# Patient Record
Sex: Male | Born: 1965 | Race: Black or African American | Hispanic: No | Marital: Married | Smoking: Former smoker
Health system: Southern US, Community
[De-identification: ages and names within clinical notes are randomized; demographics above are authoritative.]

## PROBLEM LIST (undated history)

## (undated) DIAGNOSIS — E739 Lactose intolerance, unspecified: Secondary | ICD-10-CM

---

## 2006-03-16 ENCOUNTER — Emergency Department (HOSPITAL_COMMUNITY): Admission: EM | Admit: 2006-03-16 | Discharge: 2006-03-16 | Payer: Self-pay | Admitting: Family Medicine

## 2009-05-06 ENCOUNTER — Emergency Department (HOSPITAL_COMMUNITY): Admission: EM | Admit: 2009-05-06 | Discharge: 2009-05-06 | Payer: Self-pay | Admitting: Emergency Medicine

## 2010-02-23 ENCOUNTER — Emergency Department (HOSPITAL_COMMUNITY): Admission: EM | Admit: 2010-02-23 | Discharge: 2010-02-23 | Payer: Self-pay | Admitting: Emergency Medicine

## 2011-01-21 LAB — POCT URINALYSIS DIP (DEVICE)
Bilirubin Urine: NEGATIVE
Glucose, UA: NEGATIVE mg/dL
Ketones, ur: NEGATIVE mg/dL
Nitrite: NEGATIVE
Protein, ur: NEGATIVE mg/dL
Specific Gravity, Urine: 1.02 (ref 1.005–1.030)
Urobilinogen, UA: 0.2 mg/dL (ref 0.0–1.0)
pH: 5 (ref 5.0–8.0)

## 2011-01-21 LAB — GC/CHLAMYDIA PROBE AMP, GENITAL
Chlamydia, DNA Probe: NEGATIVE
GC Probe Amp, Genital: NEGATIVE

## 2011-01-21 LAB — URINE CULTURE: Colony Count: NO GROWTH

## 2011-02-01 ENCOUNTER — Inpatient Hospital Stay (INDEPENDENT_AMBULATORY_CARE_PROVIDER_SITE_OTHER)
Admission: RE | Admit: 2011-02-01 | Discharge: 2011-02-01 | Disposition: A | Discharge status: Home / Self Care | Payer: Self-pay | Source: Ambulatory Visit | Attending: Family Medicine | Admitting: Family Medicine

## 2011-02-01 DIAGNOSIS — J309 Allergic rhinitis, unspecified: Secondary | ICD-10-CM

## 2011-12-01 ENCOUNTER — Encounter (HOSPITAL_COMMUNITY): Payer: Self-pay

## 2011-12-01 ENCOUNTER — Emergency Department (INDEPENDENT_AMBULATORY_CARE_PROVIDER_SITE_OTHER)
Admission: EM | Admit: 2011-12-01 | Discharge: 2011-12-01 | Disposition: A | Discharge status: Home / Self Care | Payer: PRIVATE HEALTH INSURANCE | Source: Home / Self Care | Attending: Family Medicine | Admitting: Family Medicine

## 2011-12-01 DIAGNOSIS — J310 Chronic rhinitis: Secondary | ICD-10-CM

## 2011-12-01 MED ORDER — FLUTICASONE PROPIONATE 50 MCG/ACT NA SUSP: 2.0000 | Freq: Every day | NASAL | Status: DC

## 2011-12-01 NOTE — ED Provider Notes (Signed)
History     CSN: [id number]  Arrival date & time 12/01/11  4098   First MD Initiated Contact with Patient 12/01/11 [phone]      Chief Complaint  Patient presents with  . Facial Pain    (Consider location/radiation/quality/duration/timing/severity/associated sxs/prior treatment) HPI Comments: Bransen presents for evaluation for nasal congestion and sinus pressure since Wednesday. He denies any other symptoms. No cough no fever. No rhinorrhea. He does report seasonal allergies for which he takes Claritin-D. He has been using over-the-counter saline spray with mild relief.  Patient is a 46 y.o. male presenting with sinusitis. The history is provided by the patient.  Sinusitis  This is a new problem. The problem has not changed since onset.There has been no fever. The patient is experiencing no pain. Associated symptoms include congestion. Pertinent negatives include no chills and no sinus pressure.    History reviewed. No pertinent past medical history.  History reviewed. No pertinent past surgical history.  History reviewed. No pertinent family history.  History  Substance Use Topics  . Smoking status: Never Smoker   . Smokeless tobacco: Not on file  . Alcohol Use: No      Review of Systems  Constitutional: Negative.  Negative for fever and chills.  HENT: Positive for congestion. Negative for rhinorrhea and sinus pressure.   Eyes: Negative.   Respiratory: Negative.   Cardiovascular: Negative.   Gastrointestinal: Negative.   Genitourinary: Negative.   Musculoskeletal: Negative.   Skin: Negative.   Neurological: Negative.     Allergies  Review of patient's allergies indicates no known allergies.  Home Medications   Current Outpatient Rx  Name Route Sig Dispense Refill  . SODIUM CHLORIDE 0.65 % NA SOLN Nasal Place 1 spray into the nose as needed.    Marland Kitchen FLUTICASONE PROPIONATE 50 MCG/ACT NA SUSP Nasal Place 2 sprays into the nose daily. 16 g 2    BP 143/78  Pulse 64   Temp(Src) 98 F (36.7 C) (Oral)  Resp 16  SpO2 100%  Physical Exam  Nursing note and vitals reviewed. Constitutional: He is oriented to person, place, and time. He appears well-developed and well-nourished.  HENT:  Head: Normocephalic and atraumatic.  Right Ear: Tympanic membrane is retracted.  Left Ear: Tympanic membrane is retracted.  Mouth/Throat: Uvula is midline, oropharynx is clear and moist and mucous membranes are normal.  Eyes: EOM are normal.  Neck: Normal range of motion.  Pulmonary/Chest: Effort normal.  Musculoskeletal: Normal range of motion.  Neurological: He is alert and oriented to person, place, and time.  Skin: Skin is warm and dry.  Psychiatric: His behavior is normal.    ED Course  Procedures (including critical care time)  Labs Reviewed - No data to display No results found.   1. Rhinitis       MDM  rx given for fluticasone        Richardo Priest, MD 12/01/11 1023

## 2011-12-01 NOTE — Discharge Instructions (Signed)
Use nasal steroid spray as directed and continue your Claritin-D as discussed. You may use an over the counter cough syrup, such as Delsym, Robitussin DM, or Mucinex DM, as needed and as directed. Should you have fever, I recommend aggressive fever control with acetaminophen (Tylenol) and/or ibuprofen. You may use these together, alternating them every 4 hours, or individually, every 8 hours. For example, take acetaminophen 500 to 1000 mg at 12 noon, then 600 to 800 mg of ibuprofen at 4 pm, then acetaminophen at 8 pm, etc. Also, stay hydrated with clear liquids. Return to care should your symptoms not improve, or worsen in any way.

## 2011-12-01 NOTE — ED Notes (Signed)
Pt has sinus pressure that started on Wednesday.

## 2013-03-14 ENCOUNTER — Encounter (HOSPITAL_COMMUNITY): Payer: Self-pay | Admitting: Family Medicine

## 2013-03-14 ENCOUNTER — Emergency Department (HOSPITAL_COMMUNITY)
Admission: EM | Admit: 2013-03-14 | Discharge: 2013-03-14 | Disposition: A | Discharge status: Home / Self Care | Payer: BC Managed Care – PPO | Attending: Emergency Medicine | Admitting: Emergency Medicine

## 2013-03-14 DIAGNOSIS — B349 Viral infection, unspecified: Secondary | ICD-10-CM

## 2013-03-14 DIAGNOSIS — J3489 Other specified disorders of nose and nasal sinuses: Secondary | ICD-10-CM | POA: Insufficient documentation

## 2013-03-14 DIAGNOSIS — J40 Bronchitis, not specified as acute or chronic: Secondary | ICD-10-CM | POA: Insufficient documentation

## 2013-03-14 DIAGNOSIS — F17200 Nicotine dependence, unspecified, uncomplicated: Secondary | ICD-10-CM | POA: Insufficient documentation

## 2013-03-14 DIAGNOSIS — B9789 Other viral agents as the cause of diseases classified elsewhere: Secondary | ICD-10-CM | POA: Insufficient documentation

## 2013-03-14 MED ORDER — BENZONATATE 100 MG PO CAPS: 100.0000 mg | ORAL_CAPSULE | Freq: Three times a day (TID) | ORAL | Status: DC

## 2013-03-14 MED ORDER — GUAIFENESIN-DM 100-10 MG/5ML PO SYRP: 5.0000 mL | ORAL_SOLUTION | Freq: Three times a day (TID) | ORAL | Status: DC | PRN

## 2013-03-14 MED ORDER — PSEUDOEPHEDRINE HCL 30 MG PO TABS: 30.0000 mg | ORAL_TABLET | ORAL | Status: DC | PRN

## 2013-03-14 MED ORDER — AZITHROMYCIN 250 MG PO TABS: 250.0000 mg | ORAL_TABLET | Freq: Every day | ORAL | Status: DC

## 2013-03-14 NOTE — ED Notes (Signed)
Pt states he has been vomiting and has diarrhea and low back pain,  This started on Tuesday and worsened by Wednesday and he has now missed work on Friday and today,  Also noted to have chills,  Vomited times 4

## 2013-03-14 NOTE — ED Provider Notes (Signed)
History     CSN: [id number]  Arrival date & time 03/14/13  1903   First MD Initiated Contact with Patient 03/14/13 2007      Chief Complaint  Patient presents with  . Cough  . Nasal Congestion    (Consider location/radiation/quality/duration/timing/severity/associated sxs/prior treatment) HPI Aaron Simmons is a 47 y.o. male who presents to ED with complaint of nasal congestion, cough, yellow sputum, shortness of breah, nausea, vomiting, diarrhea. Pt states symptoms began 3 days ago. Diarrhea and vomiting has resolved. Continues to cough and have congestion. States taking mucinex with no improvement. Denies fever, chills. No chest pain. No neck pain or stiffness. Mild headache.    History reviewed. No pertinent past medical history.  History reviewed. No pertinent past surgical history.  No family history on file.  History  Substance Use Topics  . Smoking status: Current Every Day Smoker -- 0.25 packs/day    Types: Cigarettes  . Smokeless tobacco: Not on file  . Alcohol Use: No      Review of Systems  Constitutional: Positive for chills and fatigue. Negative for fever.  HENT: Positive for congestion, sore throat and sinus pressure. Negative for neck pain and neck stiffness.   Eyes: Negative for photophobia.  Respiratory: Positive for cough and shortness of breath. Negative for chest tightness.   Cardiovascular: Negative.   Gastrointestinal: Negative for nausea, vomiting and abdominal pain.  Genitourinary: Negative for dysuria and flank pain.  Musculoskeletal: Positive for myalgias.  Skin: Negative for rash.  Neurological: Positive for headaches. Negative for dizziness.    Allergies  Review of patient's allergies indicates no known allergies.  Home Medications   Current Outpatient Rx  Name  Route  Sig  Dispense  Refill  . sodium chloride (OCEAN) 0.65 % nasal spray   Nasal   Place 1 spray into the nose as needed.           BP 129/73  Pulse 72  Temp(Src) 98.8  F (37.1 C) (Oral)  Resp 20  Ht 6' (1.829 m)  Wt 185 lb (83.915 kg)  BMI 25.08 kg/m2  SpO2 98%  Physical Exam  Nursing note and vitals reviewed. Constitutional: He is oriented to person, place, and time. He appears well-developed and well-nourished. No distress.  HENT:  Head: Normocephalic.  Right Ear: External ear normal.  Left Ear: External ear normal.  Nose: Nose normal.  Mouth/Throat: Oropharynx is clear and moist. No oropharyngeal exudate.  TMs normal bilaterally. Pharynx normal, uvula midline  Eyes: Conjunctivae are normal.  Neck: Normal range of motion. Neck supple.  Cardiovascular: Normal rate, regular rhythm and normal heart sounds.   Pulmonary/Chest: Effort normal and breath sounds normal. No respiratory distress. He has no wheezes. He has no rales. He exhibits no tenderness.  Abdominal: Soft. Bowel sounds are normal. He exhibits no distension. There is no tenderness.  Musculoskeletal: He exhibits no edema.  Lymphadenopathy:    He has no cervical adenopathy.  Neurological: He is alert and oriented to person, place, and time.  Skin: Skin is warm and dry.    ED Course  Procedures (including critical care time)  Labs Reviewed - No data to display No results found.   1. Viral syndrome   2. Bronchitis       MDM  Pt with nasal congestion, green productive sputum, fatigue. On exam, no findings. Lungs are clear. No signs of sinus disease or pharyngeal infection or abscess. Pt afebrile. Requesting antibiotics for "green sputum." explained that i believe his symptoms  are viral. Will treat symptomatically. Given a prescription for zpack if symptoms worsening. Pt voiced understanding.   Filed Vitals:   03/14/13 1925  BP: 129/73  Pulse: 72  Temp: 98.8 F (37.1 C)  TempSrc: Oral  Resp: 20  Height: 6' (1.829 m)  Weight: 185 lb (83.915 kg)  SpO2: 98%           Lottie Mussel, PA-C 03/15/13 [phone]

## 2013-03-14 NOTE — ED Notes (Signed)
Patient states that he has had cough and congestion x 3 days. States that he has tried Mucinex with minimal relief of symptoms.

## 2013-03-19 NOTE — ED Provider Notes (Signed)
Medical screening examination/treatment/procedure(s) were performed by non-physician practitioner and as supervising physician I was immediately available for consultation/collaboration.  Tiffnay Bossi, MD 03/19/13 0511 

## 2013-06-29 ENCOUNTER — Emergency Department (HOSPITAL_COMMUNITY)
Admission: EM | Admit: 2013-06-29 | Discharge: 2013-06-29 | Disposition: A | Discharge status: Home / Self Care | Payer: BC Managed Care – PPO | Source: Home / Self Care | Attending: Family Medicine | Admitting: Family Medicine

## 2013-06-29 ENCOUNTER — Encounter (HOSPITAL_COMMUNITY): Payer: Self-pay

## 2013-06-29 DIAGNOSIS — K2970 Gastritis, unspecified, without bleeding: Secondary | ICD-10-CM

## 2013-06-29 HISTORY — DX: Lactose intolerance, unspecified: E73.9

## 2013-06-29 MED ORDER — GI COCKTAIL ~~LOC~~
ORAL | Status: AC
  Filled 2013-06-29: qty 30

## 2013-06-29 MED ORDER — RANITIDINE HCL 150 MG PO TABS: 150.0000 mg | ORAL_TABLET | Freq: Two times a day (BID) | ORAL | Status: DC

## 2013-06-29 MED ORDER — GI COCKTAIL ~~LOC~~
30.0000 mL | Freq: Once | ORAL | Status: AC
  Administered 2013-06-29: 30 mL via ORAL

## 2013-06-29 MED ORDER — SUCRALFATE 1 G PO TABS
1.0000 g | ORAL_TABLET | Freq: Four times a day (QID) | ORAL | Status: DC
Start: 2013-06-29 — End: 2014-04-10

## 2013-06-29 NOTE — ED Notes (Signed)
States he is lactose intolerant , and when he eats cheese, it affects him for 2-3 weeks at a time.  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 Used Tums at frequent intervals w brief relief of pain and nausea. NAD at present

## 2013-06-29 NOTE — ED Provider Notes (Signed)
CSN: [id number]     Arrival date & time 06/29/13  4098 History   First MD Initiated Contact with Patient 06/29/13 1139     Chief Complaint  Patient presents with  . Abdominal Pain   (Consider location/radiation/quality/duration/timing/severity/associated sxs/prior Treatment) HPI Comments: Pt ate cheese on 9/13, has lactose intolerance. Usually tums manages pain and stomach upset for pt when eats cheese but isn't helping this time.   Patient is a 47 y.o. male presenting with abdominal pain. The history is provided by the patient.  Abdominal Pain This is a new problem. Episode onset: 2 days ago. The problem occurs constantly. The problem has not changed since onset.Associated symptoms include abdominal pain. Pertinent negatives include no chest pain and no shortness of breath. Nothing aggravates the symptoms. Nothing relieves the symptoms. Treatments tried: tums. The treatment provided mild relief.    Past Medical History  Diagnosis Date  . Lactose intolerance    History reviewed. No pertinent past surgical history. History reviewed. No pertinent family history. History  Substance Use Topics  . Smoking status: Current Every Day Smoker -- 0.25 packs/day    Types: Cigarettes  . Smokeless tobacco: Not on file  . Alcohol Use: No    Review of Systems  Constitutional: Negative for fever and chills.  Respiratory: Negative for shortness of breath.   Cardiovascular: Negative for chest pain.  Gastrointestinal: Positive for abdominal pain. Negative for nausea, vomiting, diarrhea, constipation and abdominal distention.    Allergies  Cheese  Home Medications   Current Outpatient Rx  Name  Route  Sig  Dispense  Refill  . ranitidine (ZANTAC) 150 MG tablet   Oral   Take 1 tablet (150 mg total) by mouth 2 (two) times daily.   60 tablet   0   . sodium chloride (OCEAN) 0.65 % nasal spray   Nasal   Place 1 spray into the nose as needed.         . sucralfate (CARAFATE) 1 G tablet  Oral   Take 1 tablet (1 g total) by mouth 4 (four) times daily.   28 tablet   0    BP 145/78  Pulse 62  Temp(Src) 97.8 F (36.6 C) (Oral)  Resp 16  SpO2 98% Physical Exam  Constitutional: He appears well-developed and well-nourished. No distress.  Cardiovascular: Normal rate and regular rhythm.   Pulmonary/Chest: Effort normal and breath sounds normal.  Abdominal: Soft. Normal appearance and bowel sounds are normal. He exhibits no distension. There is no hepatosplenomegaly. There is tenderness in the epigastric area. There is no rigidity, no rebound and no guarding.    ED Course  Procedures (including critical care time) Labs Review Labs Reviewed - No data to display Imaging Review No results found.  MDM   1. Gastritis   Pt sx resolved with GI cocktail. Rx carafate 1gm QID #28 and zantac 150mg  BID #60. Pt to avoid cheese.   ekg normal except slight bradycardia at 59bpm  Cathlyn Parsons, NP 06/29/13 1142  Cathlyn Parsons, NP 06/29/13 1144

## 2013-06-30 NOTE — ED Provider Notes (Signed)
Medical screening examination/treatment/procedure(s) were performed by a resident physician or non-physician practitioner and as the supervising physician I was immediately available for consultation/collaboration.  Kearstin Learn, MD    Tiphani Mells S Gracy Ehly, MD 06/30/13 1337 

## 2014-04-10 ENCOUNTER — Emergency Department (HOSPITAL_COMMUNITY)
Admission: EM | Admit: 2014-04-10 | Discharge: 2014-04-10 | Disposition: A | Discharge status: Home / Self Care | Payer: BC Managed Care – PPO | Attending: Emergency Medicine | Admitting: Emergency Medicine

## 2014-04-10 ENCOUNTER — Encounter (HOSPITAL_COMMUNITY): Payer: Self-pay | Admitting: Emergency Medicine

## 2014-04-10 DIAGNOSIS — H538 Other visual disturbances: Secondary | ICD-10-CM | POA: Insufficient documentation

## 2014-04-10 DIAGNOSIS — T675XXA Heat exhaustion, unspecified, initial encounter: Secondary | ICD-10-CM | POA: Insufficient documentation

## 2014-04-10 DIAGNOSIS — Y9389 Activity, other specified: Secondary | ICD-10-CM | POA: Insufficient documentation

## 2014-04-10 DIAGNOSIS — E860 Dehydration: Secondary | ICD-10-CM

## 2014-04-10 DIAGNOSIS — R110 Nausea: Secondary | ICD-10-CM | POA: Insufficient documentation

## 2014-04-10 DIAGNOSIS — X30XXXA Exposure to excessive natural heat, initial encounter: Secondary | ICD-10-CM | POA: Insufficient documentation

## 2014-04-10 DIAGNOSIS — Y9289 Other specified places as the place of occurrence of the external cause: Secondary | ICD-10-CM | POA: Insufficient documentation

## 2014-04-10 DIAGNOSIS — F17200 Nicotine dependence, unspecified, uncomplicated: Secondary | ICD-10-CM | POA: Insufficient documentation

## 2014-04-10 LAB — CBC
HCT: 43.2 % (ref 39.0–52.0)
HEMOGLOBIN: 15.2 g/dL (ref 13.0–17.0)
MCH: 34.2 pg — ABNORMAL HIGH (ref 26.0–34.0)
MCHC: 35.2 g/dL (ref 30.0–36.0)
MCV: 97.1 fL (ref 78.0–100.0)
PLATELETS: 181 10*3/uL (ref 150–400)
RBC: 4.45 MIL/uL (ref 4.22–5.81)
RDW: 12.7 % (ref 11.5–15.5)
WBC: 8.3 10*3/uL (ref 4.0–10.5)

## 2014-04-10 LAB — URINALYSIS, ROUTINE W REFLEX MICROSCOPIC
BILIRUBIN URINE: NEGATIVE
Glucose, UA: NEGATIVE mg/dL
Hgb urine dipstick: NEGATIVE
Ketones, ur: NEGATIVE mg/dL
LEUKOCYTES UA: NEGATIVE
Nitrite: NEGATIVE
PH: 6 (ref 5.0–8.0)
Protein, ur: NEGATIVE mg/dL
SPECIFIC GRAVITY, URINE: 1.022 (ref 1.005–1.030)
UROBILINOGEN UA: 0.2 mg/dL (ref 0.0–1.0)

## 2014-04-10 LAB — COMPREHENSIVE METABOLIC PANEL
ALK PHOS: 107 U/L (ref 39–117)
ALT: 26 U/L (ref 0–53)
AST: 20 U/L (ref 0–37)
Albumin: 4 g/dL (ref 3.5–5.2)
BILIRUBIN TOTAL: 0.4 mg/dL (ref 0.3–1.2)
BUN: 9 mg/dL (ref 6–23)
CHLORIDE: 101 meq/L (ref 96–112)
CO2: 25 meq/L (ref 19–32)
Calcium: 9.2 mg/dL (ref 8.4–10.5)
Creatinine, Ser: 0.94 mg/dL (ref 0.50–1.35)
GLUCOSE: 98 mg/dL (ref 70–99)
POTASSIUM: 3.9 meq/L (ref 3.7–5.3)
Sodium: 138 mEq/L (ref 137–147)
Total Protein: 7.8 g/dL (ref 6.0–8.3)

## 2014-04-10 LAB — CK: CK TOTAL: 158 U/L (ref 7–232)

## 2014-04-10 LAB — TROPONIN I: Troponin I: <0.3 ng/mL (ref <0.30)

## 2014-04-10 LAB — LIPASE, BLOOD: LIPASE: 19 U/L (ref 11–59)

## 2014-04-10 LAB — I-STAT TROPONIN, ED: TROPONIN I, POC: 0 ng/mL (ref 0.00–0.08)

## 2014-04-10 MED ORDER — SODIUM CHLORIDE 0.9 % IV BOLUS (SEPSIS)
1000.0000 mL | Freq: Once | INTRAVENOUS | Status: AC
  Administered 2014-04-10: 1000 mL via INTRAVENOUS

## 2014-04-10 MED ORDER — SODIUM CHLORIDE 0.9 % IV SOLN: 1000.0000 mL | INTRAVENOUS | Status: DC

## 2014-04-10 MED ORDER — ONDANSETRON HCL 4 MG/2ML IJ SOLN
4.0000 mg | Freq: Once | INTRAMUSCULAR | Status: AC
  Administered 2014-04-10: 4 mg via INTRAVENOUS
  Filled 2014-04-10: qty 2

## 2014-04-10 MED ORDER — SODIUM CHLORIDE 0.9 % IV SOLN
1000.0000 mL | Freq: Once | INTRAVENOUS | Status: AC
  Administered 2014-04-10: 1000 mL via INTRAVENOUS

## 2014-04-10 NOTE — ED Notes (Signed)
All symptoms started 3 days ago when he was cutting grass.

## 2014-04-10 NOTE — Discharge Instructions (Signed)
Drink lots of fluids when working out in the heat, like sports drinks.  Recheck if you feel worse in any way.  Heat-Related Illness Heat-related illnesses occur when the body is unable to properly cool itself. The body normally cools itself by sweating. However, under some conditions sweating is not enough. In these cases, a person's body temperature rises rapidly. Very high body temperatures may damage the brain or other vital organs. Some examples of heat-related illnesses include:  Heat stroke. This occurs when the body is unable to regulate its temperature. The body's temperature rises rapidly, the sweating mechanism fails, and the body is unable to cool down. Body temperature may rise to 106 F (41 C) or higher within 10 to 15 minutes. Heat stroke can cause death or permanent disability if emergency treatment is not provided.  Heat exhaustion. This is a milder form of heat-related illness that can develop after several days of exposure to high temperatures and not enough fluids. It is the body's response to an excessive loss of the water and salt contained in sweat.  Heat cramps. These usually affect people who sweat a lot during heavy activity. This sweating drains the body's salt and moisture. The low salt level in the muscles causes painful cramps. Heat cramps may also be a symptom of heat exhaustion. Heat cramps usually occur in the abdomen, arms, or legs. Get medical attention for cramps if you have heart problems or are on a low-sodium diet. Those that are at greatest risk for heat-related illnesses include:   The elderly.  Infant and the very young.  People with mental illness and chronic diseases.  People who are overweight (obese).  Young and healthy people can even succumb to heat if they participate in strenuous physical activities during hot weather. CAUSES  Several factors affect the body's ability to cool itself during extremely hot weather. When the humidity is high, sweat  will not evaporate as quickly. This prevents the body from releasing heat quickly. Other factors that can affect the body's ability to cool down include:   Age.  Obesity.  Fever.  Dehydration.  Heart disease.  Mental illness.  Poor circulation.  Sunburn.  Prescription drug use.  Alcohol use. SYMPTOMS  Heat stroke: Warning signs of heat stroke vary, but may include:  An extremely high body temperature (above 103F orally).  A fast, strong pulse.  Dizziness.  Confusion.  Red, hot, and dry skin.  No sweating.  Throbbing headache.  Feeling sick to your stomach (nauseous).  Unconsciousness. Heat exhaustion: Warning signs of heat exhaustion include:  Heavy sweating.  Tiredness.  Headache.  Paleness.  Weakness.  Feeling sick to your stomach (nauseous) or vomiting.  Muscle cramps. Heat cramps  Muscle pains or spasms. TREATMENT  Heat stroke  Get into a cool environment. An indoor place that is air-conditioned may be best.  Take a cool shower or bath. Have someone around to make sure you are okay.  Take your temperature. Make sure it is going down. Heat exhaustion  Drink plenty of fluids. Do not drink liquids that contain caffeine, alcohol, or large amounts of sugar. These cause you to lose more body fluid. Also, avoid very cold drinks. They can cause stomach cramps.  Get into a cool environment. An indoor place that is air-conditioned may be best.  Take a cool shower or bath. Have someone around to make sure you are okay.  Put on lightweight clothing. Heat cramps  Stop whatever activity you were doing. Do not attempt  to do that activity for at least 3 hours after the cramps have gone away.  Get into a cool environment. An indoor place that is air-conditioned may be best. HOME CARE INSTRUCTIONS  To protect your health when temperatures are extremely high, follow these tips:  During heavy exercise in a hot environment, drink two to four glasses  (16-32 ounces) of cool fluids each hour. Do not wait until you are thirsty to drink. Warning: If your caregiver limits the amount of fluid you drink or has you on water pills, ask how much you should drink while the weather is hot.  Do not drink liquids that contain caffeine, alcohol, or large amounts of sugar. These cause you to lose more body fluid.  Avoid very cold drinks. They can cause stomach cramps.  Wear appropriate clothing. Choose lightweight, light-colored, loose-fitting clothing.  If you must be outdoors, try to limit your outdoor activity to morning and evening hours. Try to rest often in shady areas.  If you are not used to working or exercising in a hot environment, start slowly and pick up the pace gradually.  Stay cool in an air-conditioned place if possible. If your home does not have air conditioning, go to the shopping mall or [employer].  Taking a cool shower or bath may help you cool off. SEEK MEDICAL CARE IF:   You see any of the symptoms listed above. You may be dealing with a life-threatening emergency.  Symptoms worsen or last longer than 1 hour.  Heat cramps do not get better in 1 hour. MAKE SURE YOU:   Understand these instructions.  Will watch your condition.  Will get help right away if you are not doing well or get worse. Document Released: 07/10/2008 Document Revised: 12/24/2011 Document Reviewed: 07/10/2008 [facility] Patient Information 2015 [employer], [place]. This information is not intended to replace advice given to you by your health care provider. Make sure you discuss any questions you have with your health care provider.

## 2014-04-10 NOTE — ED Notes (Signed)
Patient reports that he was out in the heat 3 days ago and developed nausea, weaknesa, and blurred vision. Patient states he has had the symptoms constant since then.

## 2014-04-10 NOTE — ED Notes (Signed)
Pt aware of the need for a urine sample. Urinal at bedside. 

## 2014-04-10 NOTE — ED Notes (Signed)
MD I. Knapp at bedside. 

## 2014-04-10 NOTE — ED Provider Notes (Signed)
CSN: [id number]     Arrival date & time 04/10/14  [id number] History   First MD Initiated Contact with Patient 04/10/14 0802     Chief Complaint  Patient presents with  . Nausea  . Dizziness  . Blurred Vision     (Consider location/radiation/quality/duration/timing/severity/associated sxs/prior Treatment) Patient is a 48 y.o. male presenting with dizziness.  Dizziness  Patient reports 3 days ago he was outside in the heat mowing grass with a push mower. He states he was outside about 2 hours however he states he took frequent breaks and drank water. He states he was sweating profusely. Later that night he started feeling bad. He reports he was having dizziness and started having some intermittent epigastric abdominal pain. The following day he continued to feel dizzy. He states he felt bad and stayed in bed all day. He didn't eat because he was having nausea. He states he did have 2 episodes of vomiting. He describes a sharp epigastric pain like "twisting a knot" in his stomach. The pain became constant that day. He also describes decreased urinary output. He denies diarrhea, he is unsure of fever, but he continued to have sweating throughout the night. Yesterday he continued to have decreased appetite because of nausea. He continued to have pain. He states he feels dizzy and feels like he is going to pass out especially when he stands up. He states he's never felt this way before.  PCP none  Past Medical History  Diagnosis Date  . Lactose intolerance    History reviewed. No pertinent past surgical history. Family History  Problem Relation Age of Onset  . Heart failure Mother   . Heart failure Sister    History  Substance Use Topics  . Smoking status: Current Every Day Smoker -- 0.25 packs/day    Types: Cigarettes  . Smokeless tobacco: Never Used  . Alcohol Use: No  employed Lives at home Lives with spouse Has a quit date to stop smoking on his birthday  Review of Systems   Neurological: Positive for dizziness.  All other systems reviewed and are negative.     Allergies  Cheese  Home Medications   Prior to Admission medications   Not on File   BP 139/89  Pulse 64  Temp(Src) 98.2 F (36.8 C) (Oral)  Resp 17  Ht 6' (1.829 m)  Wt 182 lb (82.555 kg)  BMI 24.68 kg/m2  SpO2 97%  Vital signs normal   Physical Exam  Nursing note and vitals reviewed. Constitutional: He is oriented to person, place, and time. He appears well-developed and well-nourished.  Non-toxic appearance. He does not appear ill. No distress.  HENT:  Head: Normocephalic and atraumatic.  Right Ear: External ear normal.  Left Ear: External ear normal.  Nose: Nose normal. No mucosal edema or rhinorrhea.  Mouth/Throat: Mucous membranes are normal. No dental abscesses or uvula swelling.   Mucus membranes are dry  Eyes: Conjunctivae and EOM are normal. Pupils are equal, round, and reactive to light.  Neck: Normal range of motion and full passive range of motion without pain. Neck supple.  Cardiovascular: Normal rate, regular rhythm and normal heart sounds.  Exam reveals no gallop and no friction rub.   No murmur heard. Pulmonary/Chest: Effort normal and breath sounds normal. No respiratory distress. He has no wheezes. He has no rhonchi. He has no rales. He exhibits no tenderness and no crepitus.  Abdominal: Soft. Normal appearance and bowel sounds are normal. He exhibits no distension. There is  tenderness. There is no rebound and no guarding.  Patient has diffuse abdominal tenderness on exam, however he states he's having epigastric pain.  Musculoskeletal: Normal range of motion. He exhibits no edema and no tenderness.  Moves all extremities well.   Neurological: He is alert and oriented to person, place, and time. He has normal strength. No cranial nerve deficit.  Skin: Skin is warm, dry and intact. No rash noted. No erythema. No pallor.  Psychiatric: He has a normal mood and  affect. His speech is normal and behavior is normal. His mood appears not anxious.    ED Course  Procedures (including critical care time)  Medications  ondansetron (ZOFRAN) injection 4 mg (4 mg Intravenous Given 04/10/14 0838)  0.9 %  sodium chloride infusion (0 mLs Intravenous Stopped 04/10/14 1122)    Followed by  0.9 %  sodium chloride infusion (0 mLs Intravenous Stopped 04/10/14 0949)  sodium chloride 0.9 % bolus 1,000 mL (0 mLs Intravenous Stopped 04/10/14 1205)   Recheck at 1100 patient states he's feeling much better. He has had minimal urinary output after 2 L of normal saline and drinking a bottle of water. He states his nausea is gone. His dizziness is gone. Patient is going to receive one more liter of IV fluids to make sure he is hydrated. We discussed mowing the grass earlier in the day before it gets hot or very late in the evening when it gets cooler. We also discussed drinking sports drinks when he sweating a lot in the heat.  Labs Review Results for orders placed during the hospital encounter of 04/10/14  CBC      Result Value Ref Range   WBC 8.3  4.0 - 10.5 K/uL   RBC 4.45  4.22 - 5.81 MIL/uL   Hemoglobin 15.2  13.0 - 17.0 g/dL   HCT 16.1  09.6 - 04.5 %   MCV 97.1  78.0 - 100.0 fL   MCH 34.2 (*) 26.0 - 34.0 pg   MCHC 35.2  30.0 - 36.0 g/dL   RDW 40.9  81.1 - 91.4 %   Platelets 181  150 - 400 K/uL  COMPREHENSIVE METABOLIC PANEL      Result Value Ref Range   Sodium 138  137 - 147 mEq/L   Potassium 3.9  3.7 - 5.3 mEq/L   Chloride 101  96 - 112 mEq/L   CO2 25  19 - 32 mEq/L   Glucose, Bld 98  70 - 99 mg/dL   BUN 9  6 - 23 mg/dL   Creatinine, Ser 7.82  0.50 - 1.35 mg/dL   Calcium 9.2  8.4 - 95.6 mg/dL   Total Protein 7.8  6.0 - 8.3 g/dL   Albumin 4.0  3.5 - 5.2 g/dL   AST 20  0 - 37 U/L   ALT 26  0 - 53 U/L   Alkaline Phosphatase 107  39 - 117 U/L   Total Bilirubin 0.4  0.3 - 1.2 mg/dL   GFR calc non Af Amer >90  >90 mL/min   GFR calc Af Amer >90  >90 mL/min   URINALYSIS, ROUTINE W REFLEX MICROSCOPIC      Result Value Ref Range   Color, Urine YELLOW  YELLOW   APPearance CLEAR  CLEAR   Specific Gravity, Urine 1.022  1.005 - 1.030   pH 6.0  5.0 - 8.0   Glucose, UA NEGATIVE  NEGATIVE mg/dL   Hgb urine dipstick NEGATIVE  NEGATIVE   Bilirubin Urine  NEGATIVE  NEGATIVE   Ketones, ur NEGATIVE  NEGATIVE mg/dL   Protein, ur NEGATIVE  NEGATIVE mg/dL   Urobilinogen, UA 0.2  0.0 - 1.0 mg/dL   Nitrite NEGATIVE  NEGATIVE   Leukocytes, UA NEGATIVE  NEGATIVE  TROPONIN I      Result Value Ref Range   Troponin I <0.30  <0.30 ng/mL  LIPASE, BLOOD      Result Value Ref Range   Lipase 19  11 - 59 U/L  CK      Result Value Ref Range   Total CK 158  7 - 232 U/L  I-STAT TROPOININ, ED      Result Value Ref Range   Troponin i, poc 0.00  0.00 - 0.08 ng/mL   Comment 3            Laboratory interpretation all normal     Imaging Review No results found.   EKG Interpretation None      MDM   Final diagnoses:  Heat exhaustion, initial encounter  Dehydration    Plan discharge   Devoria AlbeIva Knapp, MD, Franz DellFACEP     Iva L Knapp, MD 04/10/14 [phone]

## 2014-04-29 ENCOUNTER — Encounter (HOSPITAL_COMMUNITY): Payer: Self-pay | Admitting: Emergency Medicine

## 2014-04-29 ENCOUNTER — Emergency Department (HOSPITAL_COMMUNITY)
Admission: EM | Admit: 2014-04-29 | Discharge: 2014-04-29 | Disposition: A | Discharge status: Home / Self Care | Payer: BC Managed Care – PPO | Attending: Emergency Medicine | Admitting: Emergency Medicine

## 2014-04-29 DIAGNOSIS — H00019 Hordeolum externum unspecified eye, unspecified eyelid: Secondary | ICD-10-CM | POA: Insufficient documentation

## 2014-04-29 DIAGNOSIS — Z8639 Personal history of other endocrine, nutritional and metabolic disease: Secondary | ICD-10-CM | POA: Insufficient documentation

## 2014-04-29 DIAGNOSIS — H00013 Hordeolum externum right eye, unspecified eyelid: Secondary | ICD-10-CM

## 2014-04-29 DIAGNOSIS — Z862 Personal history of diseases of the blood and blood-forming organs and certain disorders involving the immune mechanism: Secondary | ICD-10-CM | POA: Insufficient documentation

## 2014-04-29 DIAGNOSIS — F17200 Nicotine dependence, unspecified, uncomplicated: Secondary | ICD-10-CM | POA: Insufficient documentation

## 2014-04-29 MED ORDER — CEPHALEXIN 500 MG PO CAPS: 500.0000 mg | ORAL_CAPSULE | Freq: Four times a day (QID) | ORAL | Status: AC

## 2014-04-29 NOTE — Discharge Instructions (Signed)
Only fill the antibiotic if you develop redness over your eyelid.  Warm compresses 3 times a day.

## 2014-04-29 NOTE — ED Notes (Signed)
Pt given work note stating he was seen in the ED per pt request.

## 2014-04-29 NOTE — ED Notes (Signed)
Pt states his right eye has been bothering him for 3 days  [facility] and was given some drops  Pt states he did what he was told and this morning he woke up and his right eye still hurt  Pt was given tobramycin 0.3% drops

## 2014-04-29 NOTE — ED Provider Notes (Signed)
CSN: [id number]     Arrival date & time 04/29/14  0622 History   First MD Initiated Contact with Patient 04/29/14 [phone]     Chief Complaint  Patient presents with  . Eye Problem     (Consider location/radiation/quality/duration/timing/severity/associated sxs/prior Treatment) HPI Comments: 48 year old male presents with swelling of the right eyelid, gradual in onset several days ago, gradually worsening, seen at urgent care and given tobramycin eyedrops and told to use warm compresses. No improvement, states his eye still hurts.  Patient is a 48 y.o. male presenting with eye problem. The history is provided by the patient.  Eye Problem   Past Medical History  Diagnosis Date  . Lactose intolerance    History reviewed. No pertinent past surgical history. Family History  Problem Relation Age of Onset  . Heart failure Mother   . Heart failure Sister    History  Substance Use Topics  . Smoking status: Current Every Day Smoker -- 0.25 packs/day    Types: Cigarettes  . Smokeless tobacco: Never Used  . Alcohol Use: No    Review of Systems  Constitutional: Negative for fever.  Eyes: Negative for pain.      Allergies  Cheese  Home Medications   Prior to Admission medications   Medication Sig Start Date End Date Taking? Authorizing Provider  cephALEXin (KEFLEX) 500 MG capsule Take 1 capsule (500 mg total) by mouth 4 (four) times daily. 04/29/14   Vida RollerBrian D Oreoluwa Aigner, MD   BP 124/69  Pulse 52  Temp(Src) 97.7 F (36.5 C) (Oral)  Resp 16  SpO2 99% Physical Exam  Constitutional: He appears well-developed and well-nourished. No distress.  HENT:  Head: Normocephalic and atraumatic.  Mouth/Throat: Oropharynx is clear and moist.  Eyes: Conjunctivae and EOM are normal. Pupils are equal, round, and reactive to light. Right eye exhibits no discharge. Left eye exhibits no discharge.   Hordeolum present in the right eyelid  Neck: Normal range of motion. Neck supple.    ED Course   Procedures (including critical care time) Labs Review Labs Reviewed - No data to display  Imaging Review No results found.    MDM   Final diagnoses:  Hordeolum external, right    Well appearing, sty present, benign, vital signs normal, stable for discharge with antibiotics, warm compresses, ophthalmology followup when necessary    Vida RollerBrian D Tkeyah Burkman, MD 04/29/14 [phone]

## 2016-12-09 ENCOUNTER — Emergency Department (HOSPITAL_COMMUNITY)
Admission: EM | Admit: 2016-12-09 | Discharge: 2016-12-09 | Disposition: A | Discharge status: Home / Self Care | Payer: PRIVATE HEALTH INSURANCE | Attending: Emergency Medicine | Admitting: Emergency Medicine

## 2016-12-09 ENCOUNTER — Encounter (HOSPITAL_COMMUNITY): Payer: Self-pay

## 2016-12-09 DIAGNOSIS — J069 Acute upper respiratory infection, unspecified: Secondary | ICD-10-CM

## 2016-12-09 DIAGNOSIS — F17210 Nicotine dependence, cigarettes, uncomplicated: Secondary | ICD-10-CM | POA: Insufficient documentation

## 2016-12-09 DIAGNOSIS — Z79899 Other long term (current) drug therapy: Secondary | ICD-10-CM | POA: Insufficient documentation

## 2016-12-09 DIAGNOSIS — J0110 Acute frontal sinusitis, unspecified: Secondary | ICD-10-CM

## 2016-12-09 MED ORDER — IBUPROFEN 800 MG PO TABS: 800.0000 mg | ORAL_TABLET | Freq: Three times a day (TID) | ORAL | 0 refills | Status: AC

## 2016-12-09 MED ORDER — BENZONATATE 100 MG PO CAPS: 100.0000 mg | ORAL_CAPSULE | Freq: Three times a day (TID) | ORAL | 0 refills | Status: DC

## 2016-12-09 MED ORDER — ONDANSETRON 4 MG PO TBDP: 4.0000 mg | ORAL_TABLET | Freq: Three times a day (TID) | ORAL | 0 refills | Status: DC | PRN

## 2016-12-09 MED ORDER — DOXYCYCLINE HYCLATE 100 MG PO CAPS: 100.0000 mg | ORAL_CAPSULE | Freq: Two times a day (BID) | ORAL | 0 refills | Status: AC

## 2016-12-09 NOTE — Discharge Instructions (Signed)
You have symptoms of a sinus infection. Most sinus infections are caused by viruses and therefore do not respond to antibiotics, however, you have symptoms that meet criteria for antibiotic administration. Please take all of your antibiotics until finished!   You may develop abdominal discomfort or diarrhea from the antibiotic.  You may help offset this with probiotics which you can buy or get in yogurt. Do not eat or take the probiotics until 2 hours after your antibiotic.   Your other symptoms are consistent with a viral illness. Treatment is symptomatic care and it is important to note that these symptoms may last for 7-14 days. Symptoms will be intensified and complicated by dehydration. Dehydration can also extend the duration of symptoms. Drink plenty of fluids and get plenty of rest. You should be drinking at least half a liter of water an hour to stay hydrated. Electrolyte drinks are also encouraged. You should be drinking enough fluids to make your urine light yellow, almost clear. If this is not the case, you are not drinking enough water. Ibuprofen, Naproxen, or Tylenol for pain or fever. Zofran for nausea. Tessalon for cough. Plain Mucinex may help relieve congestion. Saline sinus rinses and saline nasal sprays may also help relieve congestion. Warm liquids or Chloraseptic spray may help soothe a sore throat. Follow up with a primary care provider, as needed, for any future management of this issue.

## 2016-12-09 NOTE — ED Provider Notes (Signed)
[facility]-EMERGENCY DEPT Provider Note   CSN: [id number] Arrival date & time: 12/09/16  1059 By signing my name below, I, Levon Hedger, attest that this documentation has been prepared under the direction and in the presence of non-physician practitioner, Harolyn Rutherford, PA-C. Electronically Signed: Levon Hedger, Scribe. 12/09/2016. 11:31 AM.   History   Chief Complaint Chief Complaint  Patient presents with  . Facial Swelling   HPI Aaron Simmons is a 51 y.o. male who presents to the Emergency Department complaining of moderate, gradually worsening left sided sinus discomfort and subjective swelling onset two days ago. He notes associated fatigue, headache onset yesterday, body aches (especially back soreness), cough productive of yellow sputum, post tussive emesis x4, and rhinorrhea. Headache is left sided, moderate, feels like a pressure, nonradiating. He has applied heat to his back with mild of pain. No other alleviating or modifying factors noted. Per pt, this feels like prior sinus infections. Pt denies any fever/chill, diarrhea/nausea, chest pain, shortness of breath, or any other complaints.     The history is provided by the patient. No language interpreter was used.   Past Medical History:  Diagnosis Date  . Lactose intolerance    There are no active problems to display for this patient.  History reviewed. No pertinent surgical history.   Home Medications    Prior to Admission medications   Medication Sig Start Date End Date Taking? Authorizing Provider  benzonatate (TESSALON) 100 MG capsule Take 1 capsule (100 mg total) by mouth every 8 (eight) hours. 12/09/16   Myles Mallicoat C Sabel Hornbeck, PA-C  cephALEXin (KEFLEX) 500 MG capsule Take 1 capsule (500 mg total) by mouth 4 (four) times daily. 04/29/14   Eber Hong, MD  doxycycline (VIBRAMYCIN) 100 MG capsule Take 1 capsule (100 mg total) by mouth 2 (two) times daily. 12/09/16 12/14/16  Stace Peace C Shanell Aden, PA-C  ibuprofen (ADVIL,MOTRIN) 800 MG tablet Take 1  tablet (800 mg total) by mouth 3 (three) times daily. 12/09/16   Shelbia Scinto C Jenavive Lamboy, PA-C  Multiple Vitamin (MULTIVITAMIN WITH MINERALS) TABS tablet Take 1 tablet by mouth daily. Men's daily    Historical Provider, MD  ondansetron (ZOFRAN ODT) 4 MG disintegrating tablet Take 1 tablet (4 mg total) by mouth every 8 (eight) hours as needed for nausea or vomiting. 12/09/16   Cassian Torelli C Hang Ammon, PA-C  tobramycin (TOBREX) 0.3 % ophthalmic solution Place 1 drop into the right eye 3 (three) times daily. 04/28/14   Historical Provider, MD   Family History Family History  Problem Relation Age of Onset  . Heart failure Mother   . Heart failure Sister    Social History Social History  Substance Use Topics  . Smoking status: Current Every Day Smoker    Packs/day: 0.25    Types: Cigarettes  . Smokeless tobacco: Never Used  . Alcohol use No    Allergies   Cheese  Review of Systems Review of Systems  Constitutional: Positive for fatigue. Negative for fever.  HENT: Positive for facial swelling (subjective), rhinorrhea and sinus pressure. Negative for sore throat, trouble swallowing and voice change.   Respiratory: Positive for cough. Negative for shortness of breath.   Cardiovascular: Negative for chest pain.  Gastrointestinal: Positive for vomiting (posttussive). Negative for diarrhea and nausea.  Musculoskeletal: Positive for back pain.  All other systems reviewed and are negative.  Physical Exam Updated Vital Signs BP 133/87 (BP Location: Left Arm)   Pulse 73   Temp 97.6 F (36.4 C) (Oral)   Resp 12  Ht 6\' 1"  (1.854 m)   Wt 167 lb (75.8 kg)   SpO2 100%   BMI 22.03 kg/m   Physical Exam  Constitutional: He appears well-developed and well-nourished. No distress.  HENT:  Head: Normocephalic and atraumatic.  Right Ear: Tympanic membrane normal.  Left Ear: Tympanic membrane normal.  Mouth/Throat: Oropharynx is clear and moist.  Tenderness over left frontal sinus; no noted facial swelling or  erythema  Eyes: Conjunctivae are normal.  Neck: Neck supple.  Cardiovascular: Normal rate, regular rhythm, normal heart sounds and intact distal pulses.   Pulmonary/Chest: Effort normal and breath sounds normal. No respiratory distress.  Abdominal: Soft. There is no tenderness. There is no guarding.  Musculoskeletal: He exhibits no edema.  Lymphadenopathy:    He has no cervical adenopathy.  Neurological: He is alert.  Skin: Skin is warm and dry. He is not diaphoretic.  Psychiatric: He has a normal mood and affect. His behavior is normal.  Nursing note and vitals reviewed.  ED Treatments / Results  DIAGNOSTIC STUDIES:  Oxygen Saturation is 100% on RA, normal by my interpretation.    COORDINATION OF CARE:  11:27 AM Discussed treatment plan with pt at bedside and pt agreed to plan.   Labs (all labs ordered are listed, but only abnormal results are displayed) Labs Reviewed - No data to display  EKG  EKG Interpretation None      Radiology No results found.  Procedures Procedures (including critical care time)  Medications Ordered in ED Medications - No data to display  Initial Impression / Assessment and Plan / ED Course  I have reviewed the triage vital signs and the nursing notes.  Pertinent labs & imaging results that were available during my care of the patient were reviewed by me and considered in my medical decision making (see chart for details).      Patient presents with URI type symptoms with overlying symptoms of sinusitis. Due to the patient's facial tenderness and unilateral symptoms, antibiotic therapy is indicated. Other home care and return precautions discussed. Patient voices understanding of all instructions and is comfortable with discharge.  Vitals:   12/09/16 1103 12/09/16 1142  BP: 133/87   Pulse: 73 62  Resp: 12   Temp: 97.6 F (36.4 C)   TempSrc: Oral   SpO2: 100%   Weight: 75.8 kg   Height: 6\' 1"  (1.854 m)       Final Clinical  Impressions(s) / ED Diagnoses   Final diagnoses:  Acute non-recurrent frontal sinusitis  Upper respiratory tract infection, unspecified type   New Prescriptions Discharge Medication List as of 12/09/2016 11:31 AM    START taking these medications   Details  benzonatate (TESSALON) 100 MG capsule Take 1 capsule (100 mg total) by mouth every 8 (eight) hours., Starting Sun 12/09/2016, Print    doxycycline (VIBRAMYCIN) 100 MG capsule Take 1 capsule (100 mg total) by mouth 2 (two) times daily., Starting Sun 12/09/2016, Until Fri 12/14/2016, Print    ibuprofen (ADVIL,MOTRIN) 800 MG tablet Take 1 tablet (800 mg total) by mouth 3 (three) times daily., Starting Sun 12/09/2016, Print    ondansetron (ZOFRAN ODT) 4 MG disintegrating tablet Take 1 tablet (4 mg total) by mouth every 8 (eight) hours as needed for nausea or vomiting., Starting Sun 12/09/2016, Print       I personally performed the services described in this documentation, which was scribed in my presence. The recorded information has been reviewed and is accurate.   Anselm PancoastShawn C Yesica Kemler, PA-C  12/09/16 1152    Charlynne Pander, MD 12/09/16 [phone]

## 2016-12-09 NOTE — ED Triage Notes (Signed)
Pt with left sided facial swelling since Friday. Pt feels it is more sinus than teeth.  Tired and having fatigue.  Some discomfort.

## 2019-09-01 ENCOUNTER — Emergency Department (HOSPITAL_BASED_OUTPATIENT_CLINIC_OR_DEPARTMENT_OTHER)
Admission: EM | Admit: 2019-09-01 | Discharge: 2019-09-01 | Disposition: A | Discharge status: Home / Self Care | Payer: HRSA Program | Attending: Emergency Medicine | Admitting: Emergency Medicine

## 2019-09-01 ENCOUNTER — Other Ambulatory Visit: Payer: Self-pay

## 2019-09-01 ENCOUNTER — Encounter (HOSPITAL_BASED_OUTPATIENT_CLINIC_OR_DEPARTMENT_OTHER): Payer: Self-pay | Admitting: Emergency Medicine

## 2019-09-01 ENCOUNTER — Emergency Department (HOSPITAL_BASED_OUTPATIENT_CLINIC_OR_DEPARTMENT_OTHER): Payer: HRSA Program

## 2019-09-01 DIAGNOSIS — R05 Cough: Secondary | ICD-10-CM | POA: Diagnosis present

## 2019-09-01 DIAGNOSIS — Z20828 Contact with and (suspected) exposure to other viral communicable diseases: Secondary | ICD-10-CM | POA: Diagnosis not present

## 2019-09-01 DIAGNOSIS — Z87891 Personal history of nicotine dependence: Secondary | ICD-10-CM | POA: Diagnosis not present

## 2019-09-01 DIAGNOSIS — R059 Cough, unspecified: Secondary | ICD-10-CM

## 2019-09-01 MED ORDER — BENZONATATE 100 MG PO CAPS: 100.0000 mg | ORAL_CAPSULE | Freq: Three times a day (TID) | ORAL | 0 refills | Status: AC

## 2019-09-01 MED ORDER — PROMETHAZINE-DM 6.25-15 MG/5ML PO SYRP: 5.0000 mL | ORAL_SOLUTION | Freq: Four times a day (QID) | ORAL | 0 refills | Status: AC | PRN

## 2019-09-01 NOTE — ED Provider Notes (Signed)
[facility] EMERGENCY DEPARTMENT Provider Note   CSN: [id number] Arrival date & time: 09/01/19  1615     History   Chief Complaint Chief Complaint  Patient presents with  . Cough    HPI Aaron Simmons is a 53 y.o. male presenting for evaluation of cough.  Patient states last week he developed a mild cough.  Cough gradually worsened over the past several days.  He has been taking a medicine which has helped break up some of the congestion, however he is still coughing.  This is worse at night, making it difficult for him to sleep.  He states since his symptoms have lasted a week, he wants to rule out any infection.  He denies fevers, chills, ear pain, nasal congestion, sore throat, chest pain, shortness of breath, nausea, vomiting, abdominal pain, loss of taste or smell.  He denies sick contacts.  He denies contact with known COVID-19 positive person.  Patient quit smoking 2 months ago.  He has no other medical problems, takes no medications daily.     HPI  Past Medical History:  Diagnosis Date  . Lactose intolerance     There are no active problems to display for this patient.   History reviewed. No pertinent surgical history.      Home Medications    Prior to Admission medications   Medication Sig Start Date End Date Taking? Authorizing Provider  benzonatate (TESSALON) 100 MG capsule Take 1 capsule (100 mg total) by mouth every 8 (eight) hours. 09/01/19   Arrin Pintor, PA-C  cephALEXin (KEFLEX) 500 MG capsule Take 1 capsule (500 mg total) by mouth 4 (four) times daily. 04/29/14   Eber HongMiller, Brian, MD  ibuprofen (ADVIL,MOTRIN) 800 MG tablet Take 1 tablet (800 mg total) by mouth 3 (three) times daily. 12/09/16   Joy, Shawn C, PA-C  Multiple Vitamin (MULTIVITAMIN WITH MINERALS) TABS tablet Take 1 tablet by mouth daily. Men's daily    [provider]  ondansetron (ZOFRAN ODT) 4 MG disintegrating tablet Take 1 tablet (4 mg total) by mouth every 8 (eight) hours  as needed for nausea or vomiting. 12/09/16   Joy, Shawn C, PA-C  promethazine-dextromethorphan (PROMETHAZINE-DM) 6.25-15 MG/5ML syrup Take 5 mLs by mouth 4 (four) times daily as needed. 09/01/19   Nicolae Vasek, PA-C  tobramycin (TOBREX) 0.3 % ophthalmic solution Place 1 drop into the right eye 3 (three) times daily. 04/28/14   [provider]    Family History Family History  Problem Relation Age of Onset  . Heart failure Mother   . Heart failure Sister     Social History Social History   Tobacco Use  . Smoking status: Former Smoker    Packs/day: 0.25    Types: Cigarettes  . Smokeless tobacco: Never Used  Substance Use Topics  . Alcohol use: No  . Drug use: No     Allergies   Cheese   Review of Systems Review of Systems  Respiratory: Positive for cough.   All other systems reviewed and are negative.    Physical Exam Updated Vital Signs BP 138/78 (BP Location: Right Arm)   Pulse 63   Temp 98.4 F (36.9 C) (Oral)   Resp 18   Ht 6' (1.829 m)   Wt 84.8 kg   SpO2 98%   BMI 25.36 kg/m   Physical Exam Vitals signs and nursing note reviewed.  Constitutional:      General: He is not in acute distress.    Appearance: He is  well-developed.     Comments: Sitting comfortably in the bed in no acute distress  HENT:     Head: Normocephalic and atraumatic.  Neck:     Musculoskeletal: Normal range of motion.  Cardiovascular:     Rate and Rhythm: Normal rate and regular rhythm.     Pulses: Normal pulses.  Pulmonary:     Effort: Pulmonary effort is normal.     Breath sounds: Normal breath sounds.     Comments: Clear lung sounds in all fields.  No wheezing, rales, or rhonchi.  Speaking in full sentences.  Sats stable on room air.  No signs of respiratory distress or accessory muscle use. Abdominal:     General: There is no distension.  Musculoskeletal: Normal range of motion.  Skin:    General: Skin is warm.     Findings: No rash.  Neurological:      Mental Status: He is alert and oriented to person, place, and time.      ED Treatments / Results  Labs (all labs ordered are listed, but only abnormal results are displayed) Labs Reviewed  NOVEL CORONAVIRUS, NAA (HOSP ORDER, SEND-OUT TO REF LAB; TAT 18-24 HRS)    EKG None  Radiology Dg Chest Portable 1 View  Result Date: 09/01/2019 CLINICAL DATA:  Cough for 1 week EXAM: PORTABLE CHEST 1 VIEW COMPARISON:  None. FINDINGS: The heart size and mediastinal contours are within normal limits. Both lungs are clear. The visualized skeletal structures are unremarkable. IMPRESSION: No active disease. Electronically Signed   By: Donavan Foil M.D.   On: 09/01/2019 17:28    Procedures Procedures (including critical care time)  Medications Ordered in ED Medications - No data to display   Initial Impression / Assessment and Plan / ED Course  I have reviewed the triage vital signs and the nursing notes.  Pertinent labs & imaging results that were available during my care of the patient were reviewed by me and considered in my medical decision making (see chart for details).        Patient presenting for evaluation of 1 week history of cough.  Physical exam reassuring, he appears nontoxic.  As he remains afebrile, low suspicion for pneumonia, however as patient is concerned about infection, will obtain chest x-ray.  If negative, likely viral illness.  Discussed symptomatic treatment for viral illness.  Will perform send out covid test, as patient's respiratory status is stable.   X-ray viewed and interpreted by me, no pneumonia, pneumothorax, effusion.  As such, will treat symptomatically for presumed viral illness.  At this time, patient appears safe for discharge.  Return precautions given.  Patient states he understands agrees to plan.  Aaron Simmons was evaluated in Emergency Department on 09/01/2019 for the symptoms described in the history of present illness. He was evaluated in the context  of the global COVID-19 pandemic, which necessitated consideration that the patient might be at risk for infection with the SARS-CoV-2 virus that causes COVID-19. Institutional protocols and algorithms that pertain to the evaluation of patients at risk for COVID-19 are in a state of rapid change based on information released by regulatory bodies including the CDC and federal and state organizations. These policies and algorithms were followed during the patient's care in the ED.  Final Clinical Impressions(s) / ED Diagnoses   Final diagnoses:  Cough    ED Discharge Orders         Ordered    benzonatate (TESSALON) 100 MG capsule  Every 8 hours  09/01/19 1728    promethazine-dextromethorphan (PROMETHAZINE-DM) 6.25-15 MG/5ML syrup  4 times daily PRN     09/01/19 1728           Alveria Apley, PA-C 09/01/19 1740    Terrilee Files, MD 09/01/19 1941

## 2019-09-01 NOTE — ED Triage Notes (Signed)
Cough since Friday.  

## 2019-09-01 NOTE — Discharge Instructions (Addendum)
Your chest x-ray today was normal.  No signs of infection or pneumonia.  You likely have a viral illness.  This should be treated symptomatically. Use cough drops and liquid as needed. Make sure you are staying well-hydrated water. You were tested for coronavirus today.  The results should return in several days.  If positive, you will receive a phone call.  If negative, you will not.  Either way, you may check online on MyChart.  Wash your hands frequently to prevent spread of infection. Follow-up with your primary care doctor in 1 week if your symptoms are not improving. Return to the emergency room if you develop chest pain, difficulty breathing, or any new or worsening symptoms.

## 2019-09-01 NOTE — ED Notes (Signed)
Nausea with dry cough for the past 4 days.  Pt. Reports he has had pneumonia in past.

## 2019-09-02 LAB — NOVEL CORONAVIRUS, NAA (HOSP ORDER, SEND-OUT TO REF LAB; TAT 18-24 HRS): SARS-CoV-2, NAA: NOT DETECTED

## 2019-12-10 ENCOUNTER — Ambulatory Visit: Payer: PRIVATE HEALTH INSURANCE | Attending: Family

## 2019-12-10 DIAGNOSIS — Z23 Encounter for immunization: Secondary | ICD-10-CM | POA: Insufficient documentation

## 2019-12-10 NOTE — Progress Notes (Signed)
   Covid-19 Vaccination Clinic  Name:  Aaron Simmons    MRN: [id number] DOB: 11-28-1965  12/10/2019  Mr. [name] was observed post Covid-19 immunization for 15 minutes without incidence. He was provided with Vaccine Information Sheet and instruction to access the V-Safe system.   Mr. [name] was instructed to call 911 with any severe reactions post vaccine: Marland Kitchen Difficulty breathing  . Swelling of your face and throat  . A fast heartbeat  . A bad rash all over your body  . Dizziness and weakness    Immunizations Administered    Name Date Dose VIS Date Route   Moderna COVID-19 Vaccine 12/10/2019  2:21 PM 0.5 mL 09/15/2019 Intramuscular   Manufacturer: Moderna   Lot: [id number]   NDC: 48403-979-53

## 2020-01-12 ENCOUNTER — Ambulatory Visit: Payer: PRIVATE HEALTH INSURANCE | Attending: Family

## 2020-01-12 DIAGNOSIS — Z23 Encounter for immunization: Secondary | ICD-10-CM

## 2020-01-12 NOTE — Progress Notes (Signed)
   Covid-19 Vaccination Clinic  Name:  Aaron Simmons    MRN: [id number] DOB: 1966/10/12  01/12/2020  Mr. Aaron Simmons was observed post Covid-19 immunization for 15 minutes without incident. He was provided with Vaccine Information Sheet and instruction to access the V-Safe system.   Mr. Aaron Simmons was instructed to call 911 with any severe reactions post vaccine: Marland Kitchen Difficulty breathing  . Swelling of face and throat  . A fast heartbeat  . A bad rash all over body  . Dizziness and weakness   Immunizations Administered    Name Date Dose VIS Date Route   Moderna COVID-19 Vaccine 01/12/2020  9:40 AM 0.5 mL 09/15/2019 Intramuscular   Manufacturer: Moderna   Lot: [id number]   NDC: 93112-162-44

## 2020-01-23 ENCOUNTER — Encounter (HOSPITAL_BASED_OUTPATIENT_CLINIC_OR_DEPARTMENT_OTHER): Payer: Self-pay | Admitting: Emergency Medicine

## 2020-01-23 ENCOUNTER — Emergency Department (HOSPITAL_BASED_OUTPATIENT_CLINIC_OR_DEPARTMENT_OTHER)
Admission: EM | Admit: 2020-01-23 | Discharge: 2020-01-23 | Disposition: A | Discharge status: Home / Self Care | Payer: BC Managed Care – PPO | Attending: Emergency Medicine | Admitting: Emergency Medicine

## 2020-01-23 ENCOUNTER — Emergency Department (HOSPITAL_BASED_OUTPATIENT_CLINIC_OR_DEPARTMENT_OTHER): Payer: BC Managed Care – PPO

## 2020-01-23 ENCOUNTER — Other Ambulatory Visit: Payer: Self-pay

## 2020-01-23 DIAGNOSIS — Z91018 Allergy to other foods: Secondary | ICD-10-CM | POA: Insufficient documentation

## 2020-01-23 DIAGNOSIS — Z880 Allergy status to penicillin: Secondary | ICD-10-CM | POA: Insufficient documentation

## 2020-01-23 DIAGNOSIS — Z79899 Other long term (current) drug therapy: Secondary | ICD-10-CM | POA: Insufficient documentation

## 2020-01-23 DIAGNOSIS — S6992XA Unspecified injury of left wrist, hand and finger(s), initial encounter: Secondary | ICD-10-CM

## 2020-01-23 DIAGNOSIS — M79645 Pain in left finger(s): Secondary | ICD-10-CM | POA: Insufficient documentation

## 2020-01-23 DIAGNOSIS — Z87891 Personal history of nicotine dependence: Secondary | ICD-10-CM | POA: Diagnosis not present

## 2020-01-23 MED ORDER — ACETAMINOPHEN 500 MG PO TABS
1000.0000 mg | ORAL_TABLET | Freq: Once | ORAL | Status: AC
  Administered 2020-01-23: 12:00:00 1000 mg via ORAL
  Filled 2020-01-23: qty 2

## 2020-01-23 MED ORDER — IBUPROFEN 800 MG PO TABS
800.0000 mg | ORAL_TABLET | Freq: Once | ORAL | Status: AC
  Administered 2020-01-23: 12:00:00 800 mg via ORAL
  Filled 2020-01-23: qty 1

## 2020-01-23 NOTE — Discharge Instructions (Signed)
Follow up with the hand surgeon.  Return for redness, drainage fever.

## 2020-01-23 NOTE — ED Triage Notes (Signed)
He fell last night injuring L middle finger.

## 2020-01-23 NOTE — ED Provider Notes (Signed)
[facility] EMERGENCY DEPARTMENT Provider Note   CSN: [id number] Arrival date & time: 01/23/20  1103     History Chief Complaint  Patient presents with  . Finger Injury    Aaron Simmons is a 54 y.o. male.  54 yo M with a chief complaints of finger pain.  Patient had a nonsyncopal fall yesterday where he had lost his balance and fell on an outstretched hand.  Complaining of pain only to the left middle finger.  Had a superficial laceration to it.  Pain has persisted and the patient is having trouble bending his finger and so came to the ED for evaluation.  He had washed the wound out at home.  Tetanus is up-to-date.  The history is provided by the patient.  Hand Injury Location:  Finger Finger location:  L middle finger Injury: yes   Time since incident:  2 days Mechanism of injury: fall   Fall:    Fall occurred:  Standing   Impact surface:  Hard floor   Point of impact:  Hands   Entrapped after fall: no   Pain details:    Quality:  Aching   Radiates to:  Does not radiate   Severity:  Moderate   Onset quality:  Gradual   Duration:  2 days   Timing:  Constant   Progression:  Worsening Handedness:  Right-handed Dislocation: no   Foreign body present:  No foreign bodies Tetanus status:  Up to date Prior injury to area:  No Relieved by:  Nothing Worsened by:  Bearing weight and movement Ineffective treatments:  None tried Associated symptoms: no fever        Past Medical History:  Diagnosis Date  . Lactose intolerance     There are no problems to display for this patient.   History reviewed. No pertinent surgical history.     Family History  Problem Relation Age of Onset  . Heart failure Mother   . Heart failure Sister     Social History   Tobacco Use  . Smoking status: Former Smoker    Packs/day: 0.25    Types: Cigarettes  . Smokeless tobacco: Never Used  Substance Use Topics  . Alcohol use: Yes  . Drug use: No    Home  Medications Prior to Admission medications   Medication Sig Start Date End Date Taking? Authorizing Provider  benzonatate (TESSALON) 100 MG capsule Take 1 capsule (100 mg total) by mouth every 8 (eight) hours. 09/01/19   Caccavale, Sophia, PA-C  cephALEXin (KEFLEX) 500 MG capsule Take 1 capsule (500 mg total) by mouth 4 (four) times daily. 04/29/14   Eber Hong, MD  ibuprofen (ADVIL,MOTRIN) 800 MG tablet Take 1 tablet (800 mg total) by mouth 3 (three) times daily. 12/09/16   Joy, Shawn C, PA-C  Multiple Vitamin (MULTIVITAMIN WITH MINERALS) TABS tablet Take 1 tablet by mouth daily. Men's daily    [provider]  ondansetron (ZOFRAN ODT) 4 MG disintegrating tablet Take 1 tablet (4 mg total) by mouth every 8 (eight) hours as needed for nausea or vomiting. 12/09/16   Joy, Shawn C, PA-C  promethazine-dextromethorphan (PROMETHAZINE-DM) 6.25-15 MG/5ML syrup Take 5 mLs by mouth 4 (four) times daily as needed. 09/01/19   Caccavale, Sophia, PA-C  tobramycin (TOBREX) 0.3 % ophthalmic solution Place 1 drop into the right eye 3 (three) times daily. 04/28/14   [provider]    Allergies    Amoxicillin and Cheese  Review of Systems   Review of  Systems  Constitutional: Negative for chills and fever.  HENT: Negative for congestion and facial swelling.   Eyes: Negative for discharge and visual disturbance.  Respiratory: Negative for shortness of breath.   Cardiovascular: Negative for chest pain and palpitations.  Gastrointestinal: Negative for abdominal pain, diarrhea and vomiting.  Musculoskeletal: Positive for arthralgias. Negative for myalgias.  Skin: Negative for color change and rash.  Neurological: Negative for tremors, syncope and headaches.  Psychiatric/Behavioral: Negative for confusion and dysphoric mood.    Physical Exam Updated Vital Signs BP (!) 145/91 (BP Location: Right Arm)   Pulse 72   Temp 98.2 F (36.8 C) (Oral)   Resp 18   Ht 6' (1.829 m)   Wt 84.8 kg    SpO2 98%   BMI 25.36 kg/m   Physical Exam Vitals and nursing note reviewed.  Constitutional:      Appearance: He is well-developed.  HENT:     Head: Normocephalic and atraumatic.  Eyes:     Pupils: Pupils are equal, round, and reactive to light.  Neck:     Vascular: No JVD.  Cardiovascular:     Rate and Rhythm: Normal rate and regular rhythm.     Heart sounds: No murmur. No friction rub. No gallop.   Pulmonary:     Effort: No respiratory distress.     Breath sounds: No wheezing.  Abdominal:     General: There is no distension.     Tenderness: There is no guarding or rebound.  Musculoskeletal:        General: Tenderness present. Normal range of motion.     Cervical back: Normal range of motion and neck supple.     Comments: Patient is a superficial skin avulsion to the dorsal aspect of the left middle finger.  Appears old.  His finger is flexed at the PIP and extended at the DIP.  Is able to flex and extend passively but with pain.  Skin:    Coloration: Skin is not pale.     Findings: No rash.  Neurological:     Mental Status: He is alert and oriented to person, place, and time.  Psychiatric:        Behavior: Behavior normal.     ED Results / Procedures / Treatments   Labs (all labs ordered are listed, but only abnormal results are displayed) Labs Reviewed - No data to display  EKG None  Radiology DG Finger Middle Left  Result Date: 01/23/2020 CLINICAL DATA:  Left finger pain, fall and injury to left middle finger EXAM: LEFT MIDDLE FINGER 2+V COMPARISON:  None FINDINGS: No signs of fracture or dislocation. The distal interphalangeal joint is hyper extended. IMPRESSION: No fracture or dislocation. Mild hyper extension of the distal interphalangeal joint, findings could represent tendon or soft tissue injury. Electronically Signed   By: Zetta Bills M.D.   On: 01/23/2020 12:09    Procedures Procedures (including critical care time)  Medications Ordered in  ED Medications  acetaminophen (TYLENOL) tablet 1,000 mg (1,000 mg Oral Given 01/23/20 1135)  ibuprofen (ADVIL) tablet 800 mg (800 mg Oral Given 01/23/20 1135)    ED Course  I have reviewed the triage vital signs and the nursing notes.  Pertinent labs & imaging results that were available during my care of the patient were reviewed by me and considered in my medical decision making (see chart for details).    MDM Rules/Calculators/A&P  54 yo M with a chief complaint of a finger injury.  This happened greater than 24 hours ago.  At this point I feel the risk of closing the wound is greater than the benefit.  Patient complaining of pain and swelling to the area.  Plain film viewed by me without fracture.  Possible ligamentous injury with a boutonniere deformity.  Will place in extension.  Have him follow-up with hand surgery.  1:59 PM:  I have discussed the diagnosis/risks/treatment options with the patient and believe the pt to be eligible for discharge home to follow-up with Hand. We also discussed returning to the ED immediately if new or worsening sx occur. We discussed the sx which are most concerning (e.g., redness, fever, drainage) that necessitate immediate return. Medications administered to the patient during their visit and any new prescriptions provided to the patient are listed below.  Medications given during this visit Medications  acetaminophen (TYLENOL) tablet 1,000 mg (1,000 mg Oral Given 01/23/20 1135)  ibuprofen (ADVIL) tablet 800 mg (800 mg Oral Given 01/23/20 1135)     The patient appears reasonably screen and/or stabilized for discharge and I doubt any other medical condition or other [facility] requiring further screening, evaluation, or treatment in the ED at this time prior to discharge.   Final Clinical Impression(s) / ED Diagnoses Final diagnoses:  Injury of finger of left hand, initial encounter    Rx / DC Orders ED Discharge Orders    None        Melene Plan, DO 01/23/20 1359

## 2020-12-11 IMAGING — CR DG FINGER MIDDLE 2+V*L*
3 series · 3 of 3 positions shown · non-contrast
Comparison: None

CLINICAL DATA: Left finger pain, fall and injury to left middle
finger

EXAM:
LEFT MIDDLE FINGER 2+V

[x finger pa left]
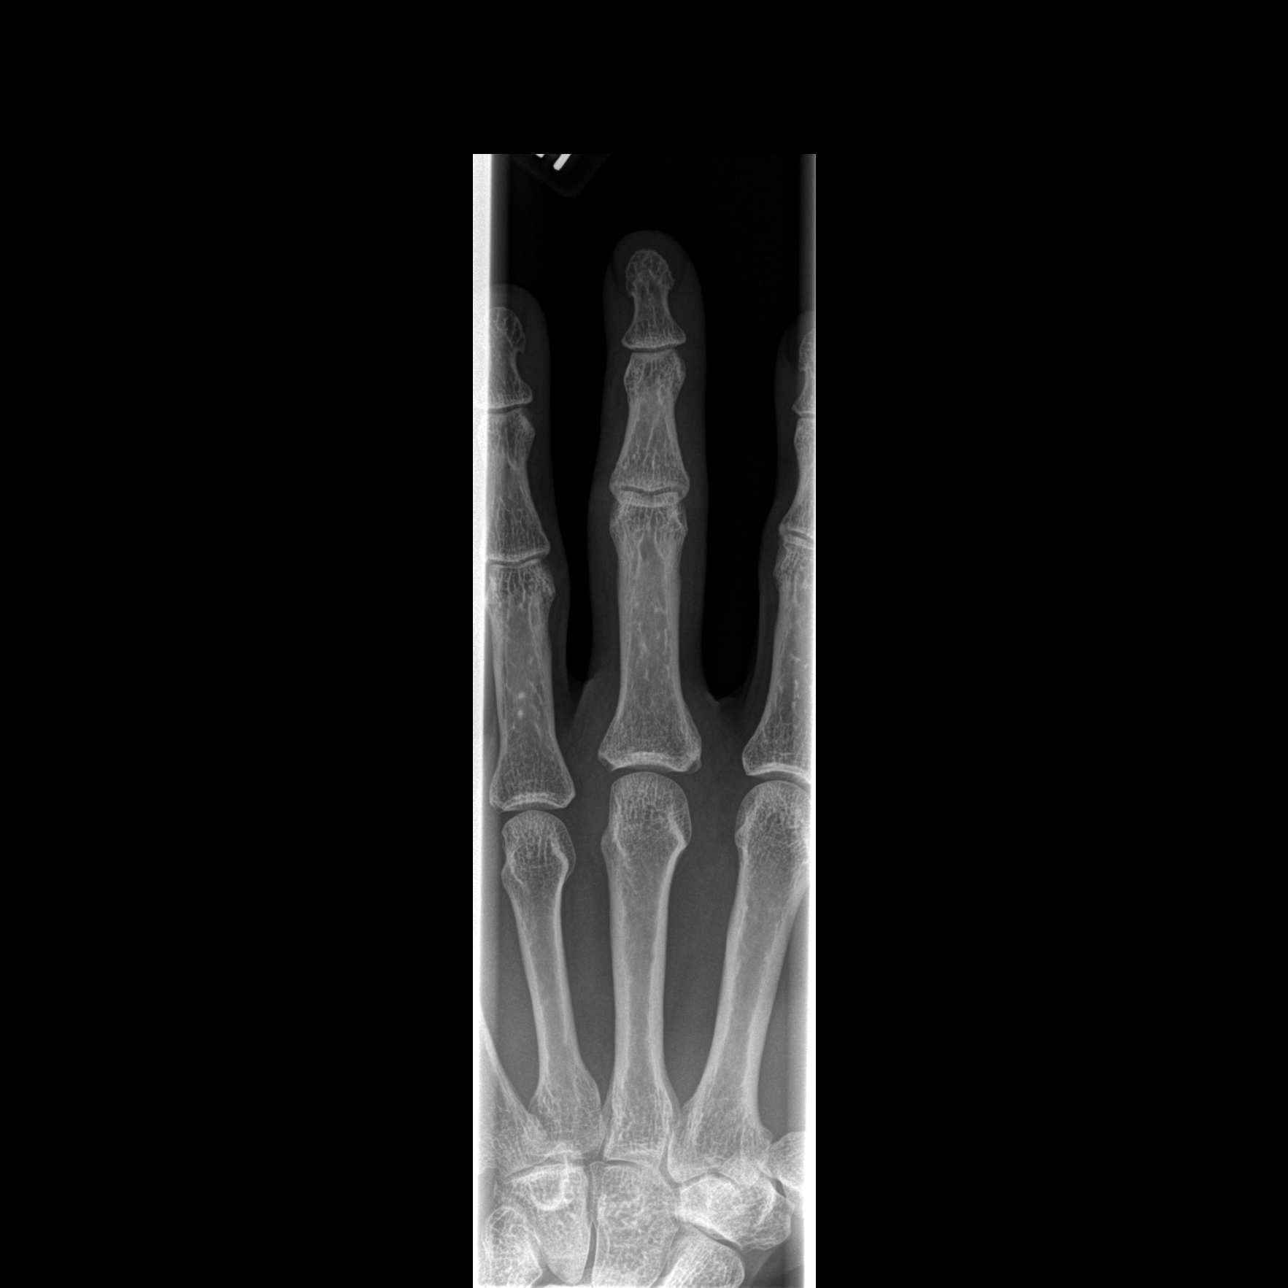

[x finger obl. left]
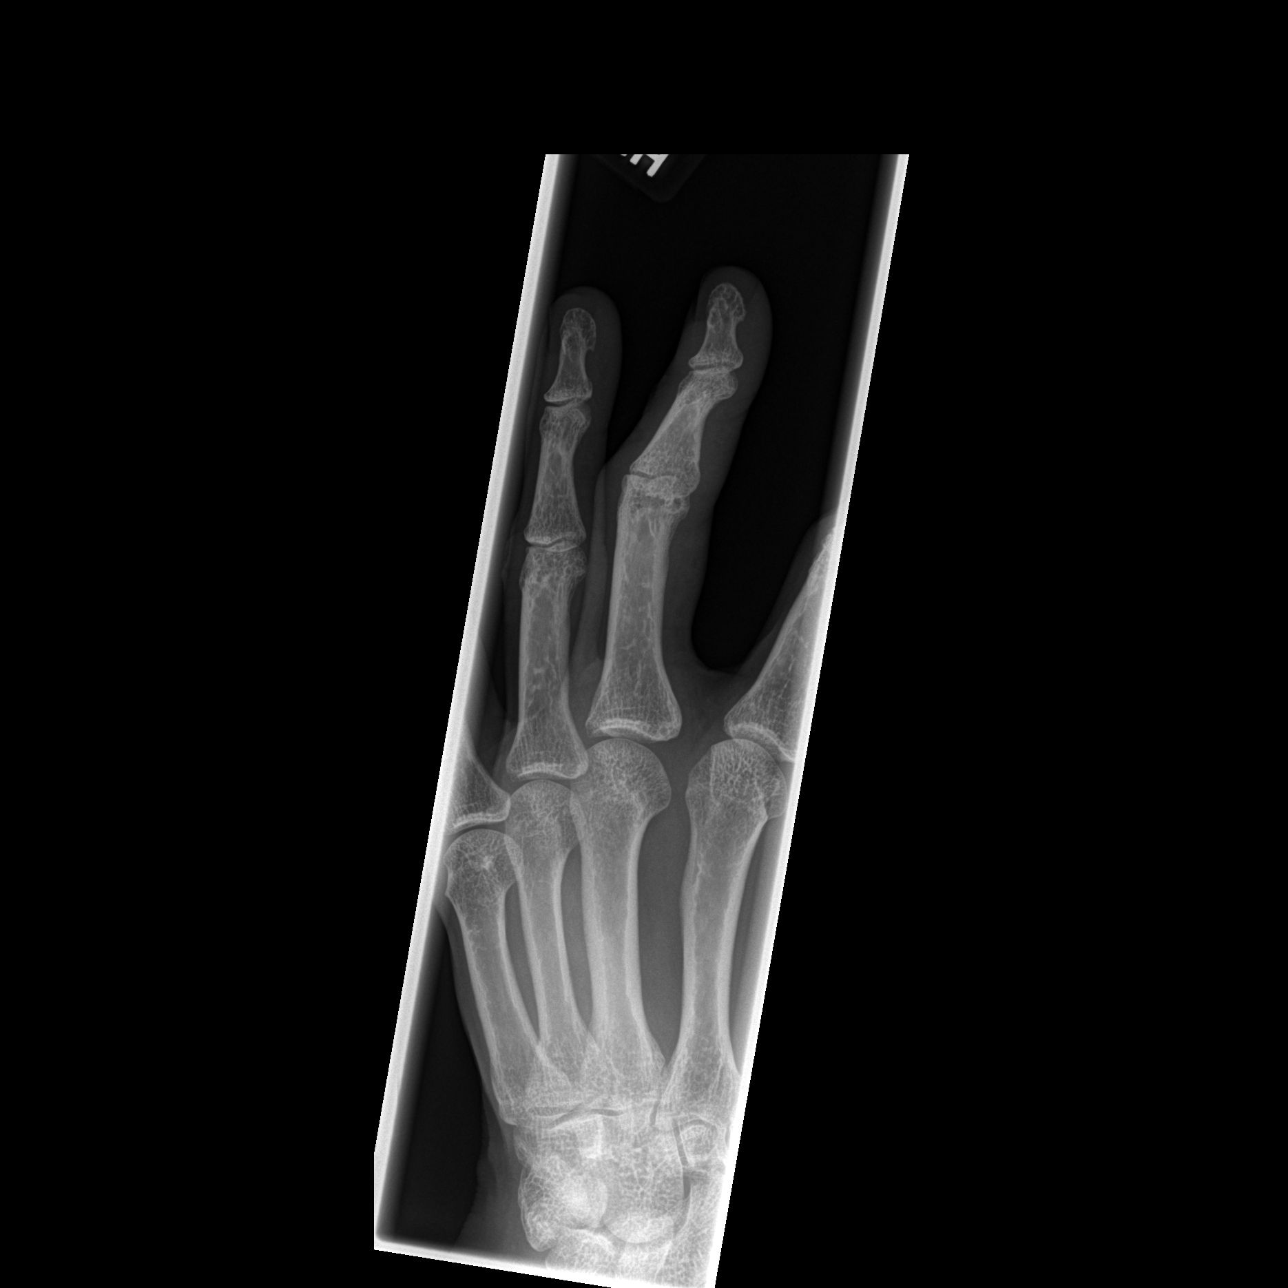

[x finger lateral left]
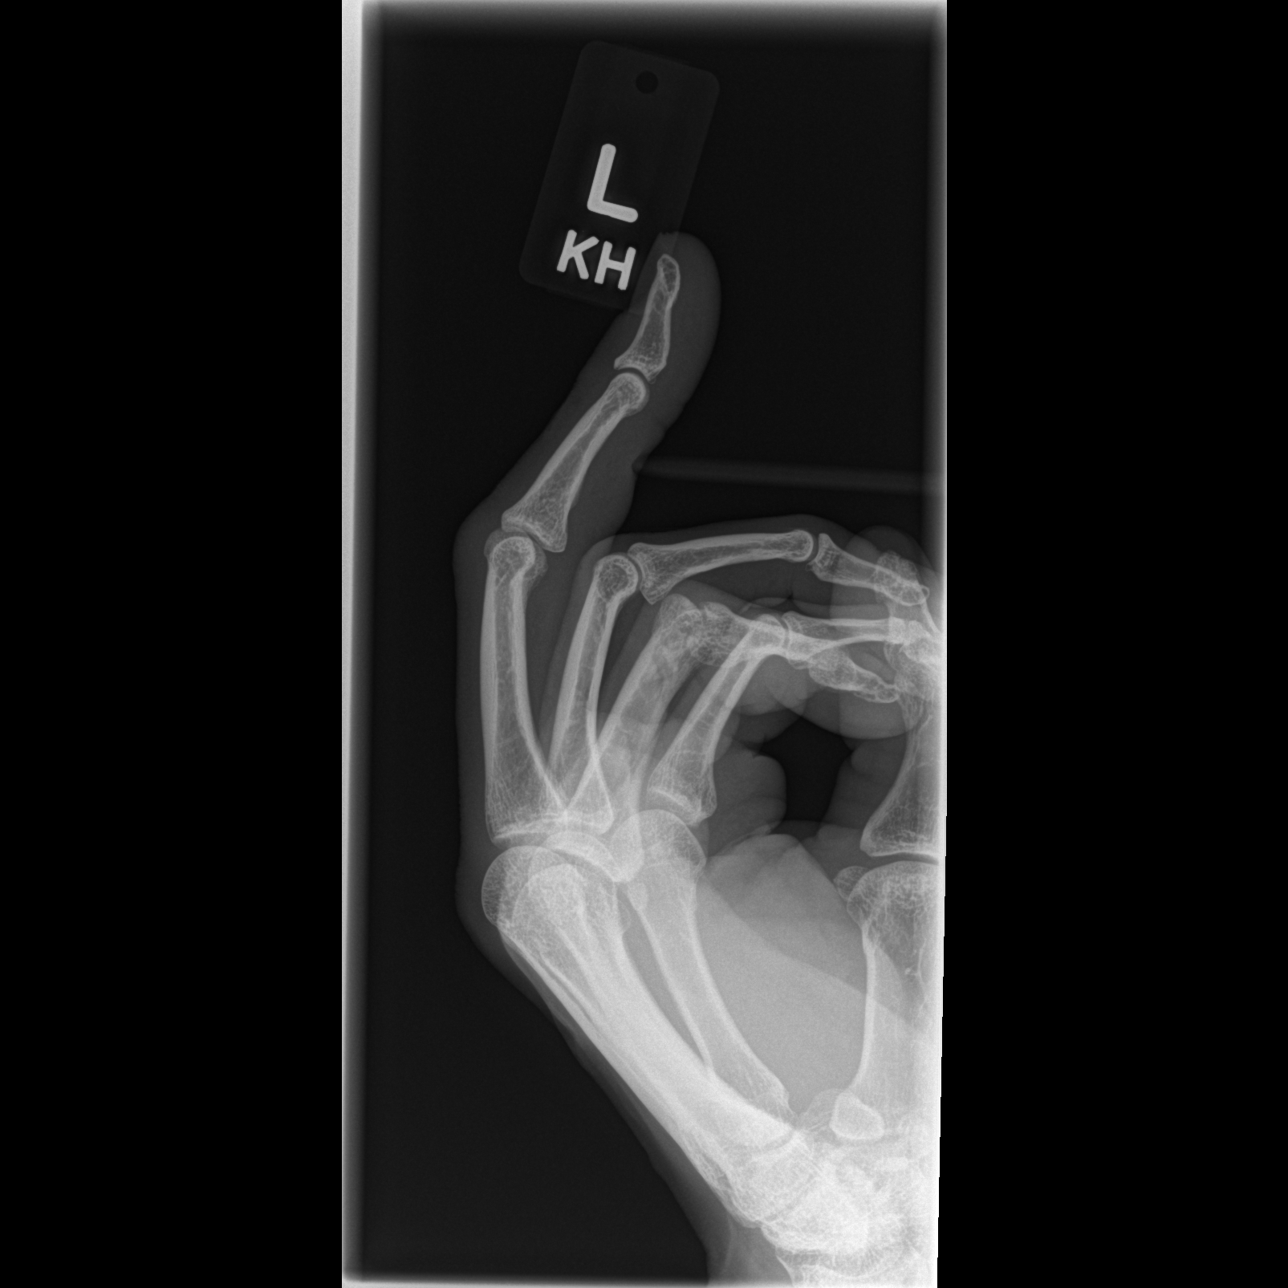

[3 of 3 positions shown; findings below may reference images not displayed]

FINDINGS: No signs of fracture or dislocation. The distal interphalangeal
joint is hyper extended.
IMPRESSION: No fracture or dislocation. Mild hyper extension of the distal
interphalangeal joint, findings could represent tendon or soft
tissue injury.

## 2023-11-28 ENCOUNTER — Encounter (HOSPITAL_BASED_OUTPATIENT_CLINIC_OR_DEPARTMENT_OTHER): Payer: Self-pay | Admitting: Emergency Medicine

## 2023-11-28 ENCOUNTER — Other Ambulatory Visit: Payer: Self-pay

## 2023-11-28 ENCOUNTER — Emergency Department (HOSPITAL_BASED_OUTPATIENT_CLINIC_OR_DEPARTMENT_OTHER)
Admission: EM | Admit: 2023-11-28 | Discharge: 2023-11-28 | Disposition: A | Discharge status: Home / Self Care | Payer: PRIVATE HEALTH INSURANCE | Attending: Emergency Medicine | Admitting: Emergency Medicine

## 2023-11-28 DIAGNOSIS — J101 Influenza due to other identified influenza virus with other respiratory manifestations: Secondary | ICD-10-CM | POA: Insufficient documentation

## 2023-11-28 DIAGNOSIS — Z20822 Contact with and (suspected) exposure to covid-19: Secondary | ICD-10-CM | POA: Insufficient documentation

## 2023-11-28 DIAGNOSIS — J111 Influenza due to unidentified influenza virus with other respiratory manifestations: Secondary | ICD-10-CM

## 2023-11-28 DIAGNOSIS — M791 Myalgia, unspecified site: Secondary | ICD-10-CM | POA: Diagnosis present

## 2023-11-28 LAB — RESP PANEL BY RT-PCR (RSV, FLU A&B, COVID)  RVPGX2
Influenza A by PCR: POSITIVE — AB
Influenza B by PCR: NEGATIVE
Resp Syncytial Virus by PCR: NEGATIVE
SARS Coronavirus 2 by RT PCR: NEGATIVE

## 2023-11-28 MED ORDER — ONDANSETRON 4 MG PO TBDP: 4.0000 mg | ORAL_TABLET | Freq: Four times a day (QID) | ORAL | 0 refills | Status: AC | PRN

## 2023-11-28 NOTE — ED Provider Notes (Signed)
[place] [facility]ERG[facility] DEPARTMENT AT [facility] Provider Note   CSN: [id number] Arrival date & time: 11/28/23  1819     History  Chief Complaint  Patient presents with   Flulike Symptoms    Aaron Simmons is a 58 y.o. male with noncontributory past medical history who presents with concern for body aches, cough, flulike symptoms and Sunday.  No flu vaccine this year.  Denies any chest pain, shortness of breath.  HPI     Home Medications Prior to Admission medications   Medication Sig Start Date End Date Taking? Authorizing Provider  ondansetron (ZOFRAN-ODT) 4 MG disintegrating tablet Take 1 tablet (4 mg total) by mouth every 6 (six) hours as needed for nausea or vomiting. 11/28/23  Yes Tige Meas H, PA-C  benzonatate (TESSALON) 100 MG capsule Take 1 capsule (100 mg total) by mouth every 8 (eight) hours. 09/01/19   Caccavale, Sophia, PA-C  cephALEXin (KEFLEX) 500 MG capsule Take 1 capsule (500 mg total) by mouth 4 (four) times daily. 04/29/14   Eber Hong, MD  ibuprofen (ADVIL,MOTRIN) 800 MG tablet Take 1 tablet (800 mg total) by mouth 3 (three) times daily. 12/09/16   Joy, Shawn C, PA-C  Multiple Vitamin (MULTIVITAMIN WITH MINERALS) TABS tablet Take 1 tablet by mouth daily. Men's daily    [provider]  promethazine-dextromethorphan (PROMETHAZINE-DM) 6.25-15 MG/5ML syrup Take 5 mLs by mouth 4 (four) times daily as needed. 09/01/19   Caccavale, Sophia, PA-C  tobramycin (TOBREX) 0.3 % ophthalmic solution Place 1 drop into the right eye 3 (three) times daily. 04/28/14   [provider]      Allergies    Amoxicillin and Cheese    Review of Systems   Review of Systems  Respiratory:  Positive for cough.   Musculoskeletal:  Positive for myalgias.  All other systems reviewed and are negative.   Physical Exam Updated Vital Signs BP 138/89 (BP Location: Left Arm)   Pulse 85   Temp 98 F (36.7 C) (Oral)   Resp 17   Ht 6' (1.829 m)   Wt 83.5 kg    SpO2 95%   BMI 24.95 kg/m  Physical Exam Vitals and nursing note reviewed.  Constitutional:      General: He is not in acute distress.    Appearance: Normal appearance.  HENT:     Head: Normocephalic and atraumatic.     Mouth/Throat:     Comments: No significant posterior oropharynx erythema, swelling, exudate. Uvula midline, tonsils 1+ bilaterally.  No trismus, stridor, evidence of PTA, floor of mouth swelling or redness.   Eyes:     General:        Right eye: No discharge.        Left eye: No discharge.  Cardiovascular:     Rate and Rhythm: Normal rate and regular rhythm.  Pulmonary:     Effort: Pulmonary effort is normal. No respiratory distress.     Comments: No wheezing, rhonchi, stridor, rales throughout with no increased work of breathing Musculoskeletal:        General: No deformity.  Skin:    General: Skin is warm and dry.  Neurological:     Mental Status: He is alert and oriented to person, place, and time.  Psychiatric:        Mood and Affect: Mood normal.        Behavior: Behavior normal.     ED Results / Procedures / Treatments   Labs (all labs ordered are listed, but  only abnormal results are displayed) Labs Reviewed  RESP PANEL BY RT-PCR (RSV, FLU A&B, COVID)  RVPGX2 - Abnormal; Notable for the following components:      Result Value   Influenza A by PCR POSITIVE (*)    All other components within normal limits    EKG None  Radiology No results found.  Procedures Procedures    Medications Ordered in ED Medications - No data to display  ED Course/ Medical Decision Making/ A&P                                 Medical Decision Making Risk Prescription drug management.   This is a well-appearing 58yo male who presents with concern for 4 days of cough, fever, body aches, headache.  My emergent differential diagnosis includes acute upper respiratory infection with COVID, flu, RSV versus new asthma presentation, acute bronchitis, less  clinical concern for pneumonia.  Also considered other ENT emergencies, Ludwig angina, strep pharyngitis, mono, versus epiglottis, tonsillitis versus other.  This is not an exhaustive differential.  On my exam patient is overall well-appearing, they have temperature of 98, breathing unlabored, no tachypnea, no respiratory distress, stable oxygen saturation.  Patient without tachycardia.  RVP independently reviewed by myself shows positive for flu.  Patient symptoms are consistent with flu.  Outside of window for Tamiflu efficacy as symptoms started previous weekend, greater than 72 hours ago, recommended supportive care, discharged with nausea medication as needed, return precautions given.  Encouraged ibuprofen, Tylenol, rest, plenty of fluids.  Discussed extensive return precautions.  Patient discharged in stable condition at this time.  Final Clinical Impression(s) / ED Diagnoses Final diagnoses:  Flu    Rx / DC Orders ED Discharge Orders          Ordered    ondansetron (ZOFRAN-ODT) 4 MG disintegrating tablet  Every 6 hours PRN        11/28/23 1939              [place] 11/28/23 1946    Glyn Ade, MD 11/28/23 2250

## 2023-11-28 NOTE — ED Triage Notes (Signed)
Pt reports having cough, body aches, and flu like symptoms since Sunday

## 2023-11-28 NOTE — Discharge Instructions (Signed)
You have the flu this is a viral infection that will likely start to improve after 7-10 days, antibiotics are not helpful in treating viral infections.  You may use Zofran as needed for nausea. Since your symptoms have been present for more than 2 days Tamiflu will give no additional benefit.  Please make sure you are drinking plenty of fluids. You can treat your symptoms supportively with tylenol 650 mg/1000mg  and ibuprofen 600 mg every 6 hours for fevers and pains. For nasal congestion you can use Zyrtec and Flonase to help with nasal congestion. To treat cough you can use over the counter cough medications such as Mucinex DM or Robitussin and throat lozenges. If your symptoms are not improving please follow up with you Primary doctor.   If you develop persistent fevers, shortness of breath or difficulty breathing, chest pain, severe headache and neck pain, persistent nausea and vomiting or other new or concerning symptoms return to the Emergency department.
